# Patient Record
Sex: Male | Born: 1985 | Race: White | Hispanic: No | Marital: Married | Smoking: Never smoker
Health system: Southern US, Community
[De-identification: ages and names within clinical notes are randomized; demographics above are authoritative.]

---

## 2012-06-23 ENCOUNTER — Emergency Department: Payer: Self-pay | Admitting: Emergency Medicine

## 2014-04-28 IMAGING — CR RIGHT MIDDLE FINGER 2+V
1 series · 3 of 3 positions shown · non-contrast
Comparison: none

REASON FOR EXAM: laceration
COMMENTS:   May transport without cardiac monitor

[Series 1: pa · 0.17mm/px · 3 of 3 slices shown]
[im 1/3]
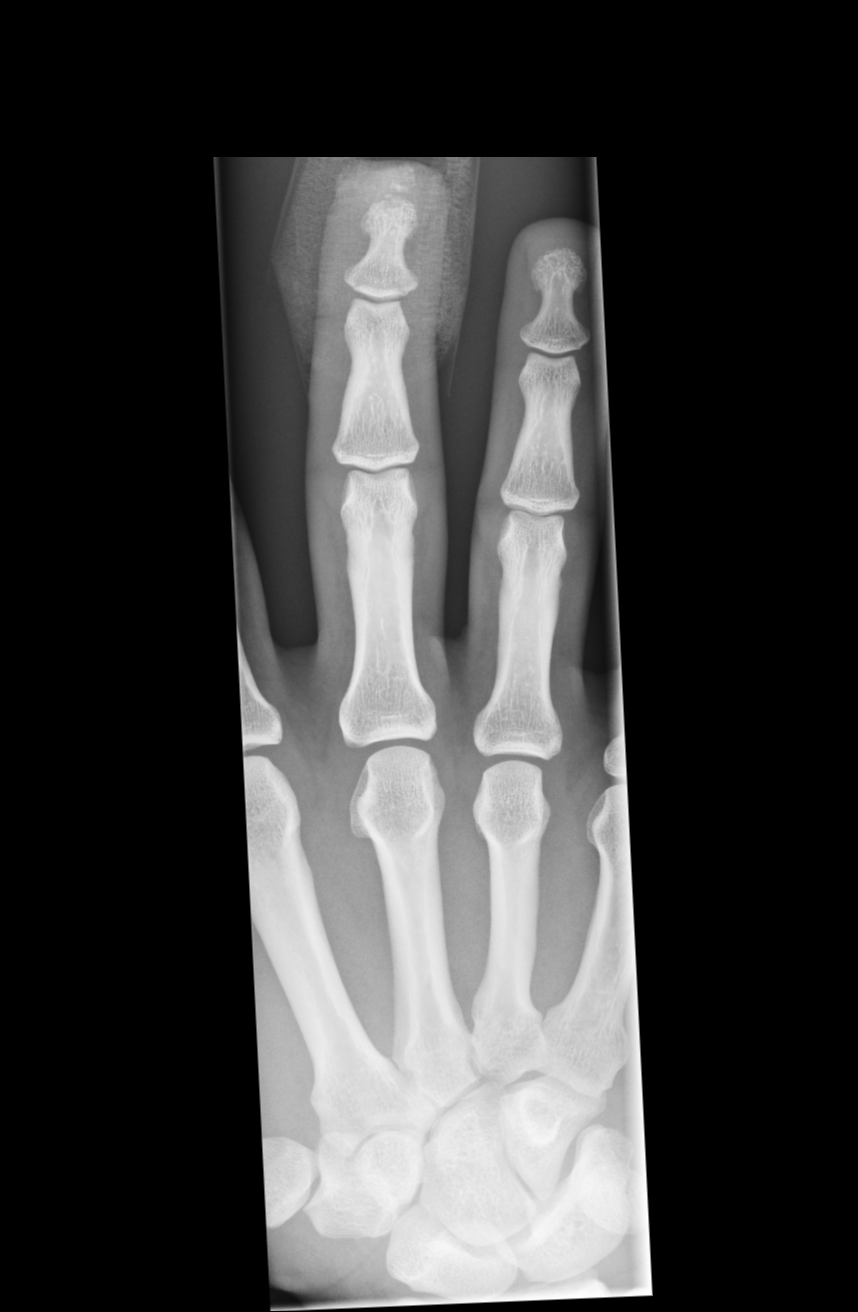
[im 2/3]
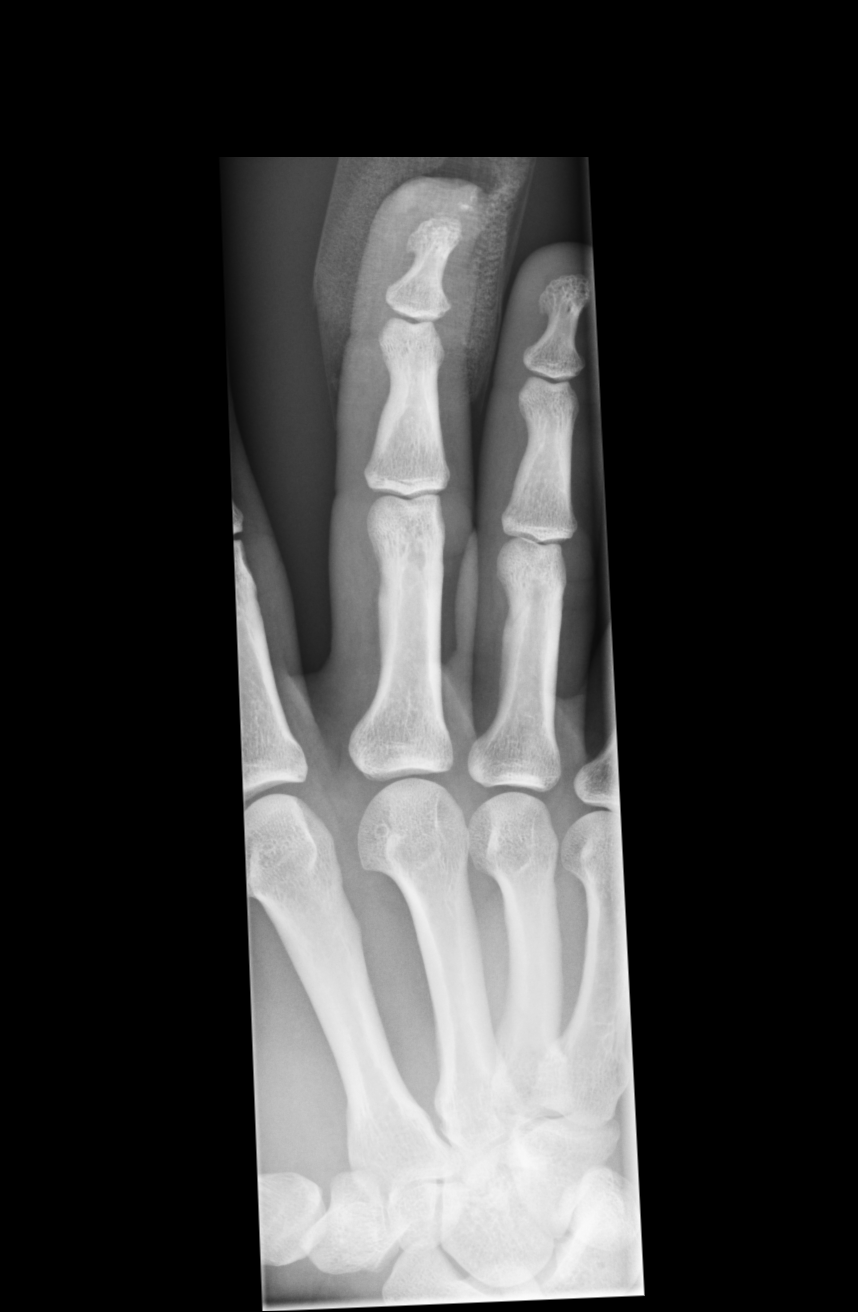
[im 3/3]
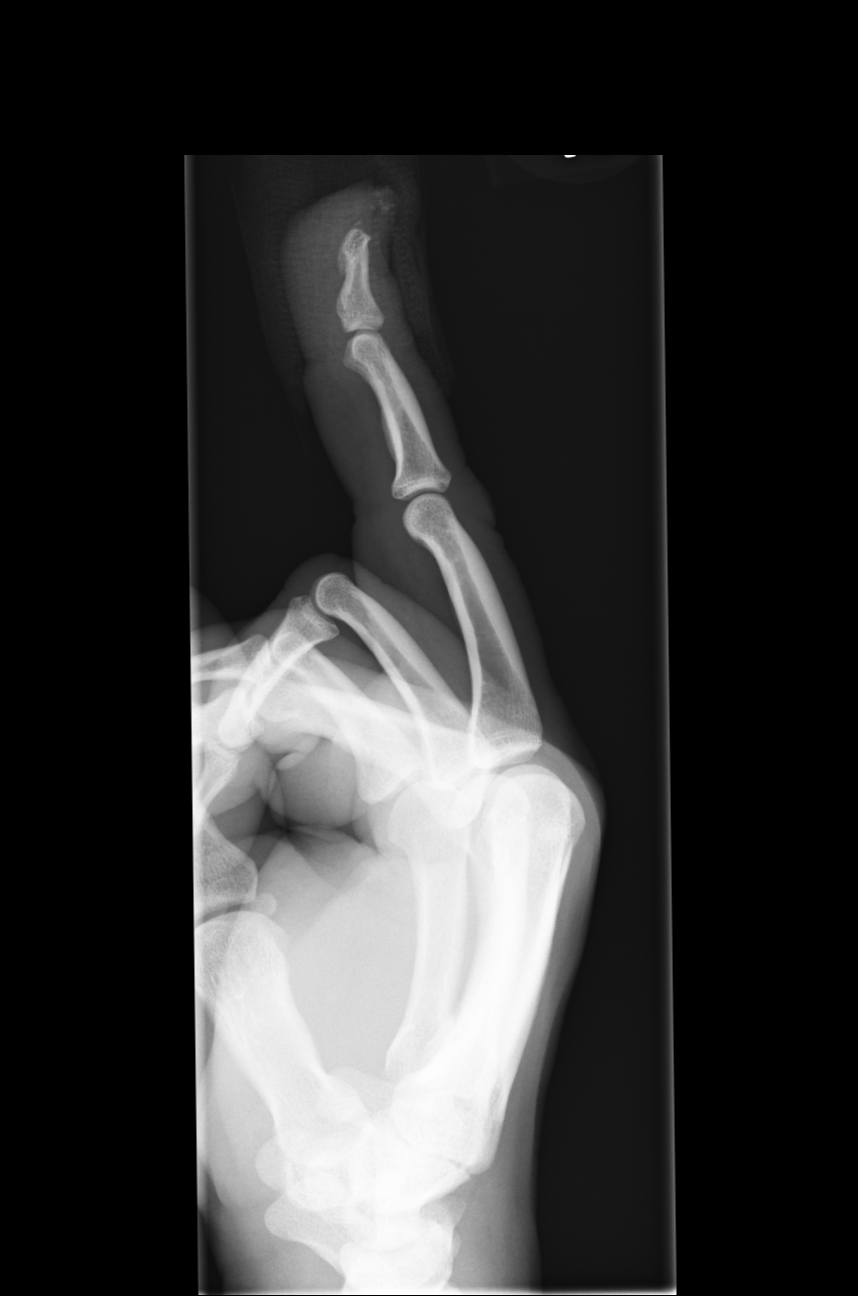

[3 of 3 positions shown; findings below may reference images not displayed]

PROCEDURE:     DXR - DXR FINGER MID 3RD DIGIT RT HAND  - June 23, 2012  [DATE]

RESULT:     There is a bandage present. There is traumatic amputation of the
soft tissue at the distal right third finger with avulsion of the tuft.
There may be additional nondisplaced fracture in the distal tuft of the
distal phalanx.
IMPRESSION: Soft tissue bony injury as described.

[REDACTED]

## 2014-11-09 ENCOUNTER — Ambulatory Visit (INDEPENDENT_AMBULATORY_CARE_PROVIDER_SITE_OTHER): Payer: Self-pay | Admitting: Family Medicine

## 2014-11-09 VITALS — BP 118/86 | HR 69 | Temp 98.5°F | Resp 16 | Ht 74.0 in | Wt 231.0 lb

## 2014-11-09 DIAGNOSIS — Z024 Encounter for examination for driving license: Secondary | ICD-10-CM

## 2014-11-09 DIAGNOSIS — Z029 Encounter for administrative examinations, unspecified: Secondary | ICD-10-CM

## 2014-11-09 NOTE — Progress Notes (Signed)
Patient ID: Jacob Lyons, male    DOB: [birth year]  Age: 29 y.o. MRN: [id number]  Chief Complaint  Patient presents with  . 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 He has healthy.  Past medical history: Operations: Only had a cyst removed from his scalp Illnesses: None Medications: None Allergies: None   Current allergies, medications, problem list, past/family and social histories reviewed.  Objective:  BP 118/86 mmHg  Pulse 69  Temp(Src) 98.5 F (36.9 C)  Resp 16  Ht  (1.88 m)  Wt 231 lb (104.781 kg)  BMI 29.65 kg/m2  SpO2 95%  No acute distress. TMs are normal except for a little old scarring. His eyes PERRLA. Fundi benign. Throat clear. Neck supple without nodes or thyromegaly. No carotid bruits. Chest is clear to auscultation. Heart regular without murmurs. Abdomen is soft without organomegaly, masses, or tenderness. Normal male external genitalia, circumcised, testes descended with no abnormalities. No hernias. Extremities unremarkable. Skin unremarkable. Spine normal. Deep tender reflexes 1+, symmetrical.  Assessment & Plan:   Assessment: 1. Driver's permit PE (physical examination)       Plan: 2 year DOT card completed Patient Instructions  Return as needed  Advised to be cautious with his weight.     Return if symptoms worsen or fail to improve.   HOPPER,Stephenia Vogan, MD 11/09/2014

## 2014-11-09 NOTE — Patient Instructions (Signed)
Return as needed  Advised to be cautious with his weight.

## 2016-11-01 ENCOUNTER — Encounter: Payer: Self-pay | Admitting: Physician Assistant

## 2016-11-01 ENCOUNTER — Ambulatory Visit (INDEPENDENT_AMBULATORY_CARE_PROVIDER_SITE_OTHER): Payer: Self-pay | Admitting: Physician Assistant

## 2016-11-01 VITALS — BP 136/82 | HR 78 | Temp 98.1°F | Resp 16 | Ht 71.85 in | Wt 233.8 lb

## 2016-11-01 DIAGNOSIS — Z0289 Encounter for other administrative examinations: Secondary | ICD-10-CM

## 2016-11-01 NOTE — Progress Notes (Signed)
Commercial Driver Medical Examination   Jacob Lyons is a 31 y.o. male with a pertinent medical history of no previous pertinent medical problems who presents today for a commercial driver fitness determination physical exam. The patient reports no problems today. In the past the patient reports receiving 2 year certificates. He denies focal neurological deficits, vision and hearing changes. He denies the habitual use of benzodiazepines, opioids, amphetamines and denies illicit drug use.   Current medications, family history, allergies, social history reviewed by me and exist elsewhere in the encounter.   Review of Systems  Constitutional: Negative for chills, diaphoresis and fever.  Eyes: Negative.   Respiratory: Negative for cough, hemoptysis, sputum production, shortness of breath and wheezing.   Cardiovascular: Negative for chest pain, orthopnea and leg swelling.  Gastrointestinal: Negative for abdominal pain, blood in stool, constipation, diarrhea, heartburn, melena, nausea and vomiting.  Genitourinary: Negative for dysuria, flank pain, frequency, hematuria and urgency.  Skin: Negative for rash.  Neurological: Negative for dizziness, sensory change, speech change, focal weakness and headaches.    Objective:     Vision/hearing:  Visual Acuity Screening   Right eye Left eye Both eyes  Without correction:  With correction:     Comments: Field of vision: 85 bilateral Color Vision: Normal   Hearing Screening Comments: Whisper Test passed at 10 feet bilateral  Applicant can recognize and distinguish among traffic control signals and devices showing standard red, green, and amber colors.  Corrective lenses required: No  Monocular Vision?: No  Hearing aid requirement: No  Physical Exam  Constitutional: He is oriented to person, place, and time. He appears well-developed. He is active and cooperative.  Non-toxic appearance.  HENT:  Right Ear: Hearing,  tympanic membrane, external ear and ear canal normal.  Left Ear: Hearing, tympanic membrane, external ear and ear canal normal.  Nose: Nose normal. Right sinus exhibits no maxillary sinus tenderness and no frontal sinus tenderness. Left sinus exhibits no maxillary sinus tenderness and no frontal sinus tenderness.  Mouth/Throat: Uvula is midline, oropharynx is clear and moist and mucous membranes are normal. No oropharyngeal exudate, posterior oropharyngeal edema or tonsillar abscesses.  Eyes: Pupils are equal, round, and reactive to light. Conjunctivae and EOM are normal.  Cardiovascular: Normal rate, regular rhythm, S1 normal, S2 normal, normal heart sounds, intact distal pulses and normal pulses.  Exam reveals no gallop and no friction rub.   No murmur heard. Pulmonary/Chest: Effort normal. No stridor. No tachypnea. No respiratory distress. He has no wheezes. He has no rales.  Abdominal: Soft. Normal appearance and bowel sounds are normal. He exhibits no distension and no mass. There is no tenderness. There is no rigidity, no rebound, no guarding and no CVA tenderness. No hernia.  Musculoskeletal: He exhibits no edema.  Lymphadenopathy:       Head (right side): No submandibular and no tonsillar adenopathy present.       Head (left side): No submandibular and no tonsillar adenopathy present.    He has no cervical adenopathy.  Neurological: He is alert and oriented to person, place, and time. He has normal strength and normal reflexes. He is not disoriented. No cranial nerve deficit or sensory deficit. He exhibits normal muscle tone. Coordination and gait normal.  Skin: Skin is warm and dry. He is not diaphoretic. No pallor.  Psychiatric: His behavior is normal.  Vitals reviewed.   BP 136/82 (BP Location: Right Arm, Patient[place]azilandsition: Sitting, Cuff Size: Normal)   Pulse 78  Temp 98.1 F (36.7 C) (Oral)   Resp 16   Ht 5' 11.85" (1.825 m)   Wt 233 lb 12.8 oz (106.1 kg)   SpO2 100%   BMI  31.84 kg/m   Labs: Comments: SG: 1.010; Protein: Neg; Glucose: Neg; Blood: Neg (UA Results)  Assessment:    Healthy male exam.  Meets standards in 17 CFR 391.41;  qualifies for 2 year certificate.    Plan:    Medical examiners certificate completed and printed. Return as needed.    Deliah Boston, MS, PA-C 5:15 PM, 11/01/2016

## 2016-11-03 ENCOUNTER — Ambulatory Visit: Payer: Self-pay | Admitting: Family Medicine

## 2016-11-03 ENCOUNTER — Ambulatory Visit: Payer: Self-pay | Admitting: Physician Assistant

## 2022-09-17 ENCOUNTER — Encounter: Payer: Self-pay | Admitting: Urology

## 2022-09-17 ENCOUNTER — Ambulatory Visit (INDEPENDENT_AMBULATORY_CARE_PROVIDER_SITE_OTHER): Payer: Commercial Managed Care - HMO | Admitting: Urology

## 2022-09-17 VITALS — BP 157/88 | HR 99 | Ht 72.0 in | Wt 230.0 lb

## 2022-09-17 DIAGNOSIS — Z3009 Encounter for other general counseling and advice on contraception: Secondary | ICD-10-CM | POA: Diagnosis not present

## 2022-09-17 NOTE — Patient Instructions (Signed)

## 2022-09-17 NOTE — Progress Notes (Signed)
09/17/2022 3:54 PM   Jacob Lyons May 29, 1985 [id number]  Referring provider: Wilford Corner, PA-C [address] [place],  [place] [address]  Chief Complaint  Patient presents with   sterilization   VAS Consult    HPI: Jacob Lyons is a 37 y.o. male who presents for vasectomy counseling.  Married with 2 children Denies prior history urologic problems including chronic scrotal pain, epididymitis or orchitis No previous history inguinal hernia or pelvic surgery No history of bleeding or clotting disorders   PMH: No past medical history on file.  Surgical History: No past surgical history on file.  Home Medications:  Allergies as of 09/17/2022   No Known Allergies      Medication List    as of September 17, 2022  3:54 PM   You have not been prescribed any medications.     Allergies: No Known Allergies  Family History: No family history on file.  Social History:  reports that he has never smoked. He has never used smokeless tobacco. He reports that he does not drink alcohol and does not use drugs.   Physical Exam: BP (!) 157/88   Pulse 99   Ht 6' (1.829 m)   Wt 230 lb (104.3 kg)   BMI 31.19 kg/m   Constitutional:  Alert and oriented, No acute distress. HEENT: [place] AT Respiratory: Normal respiratory effort, no increased work of breathing. GU: Phallus without lesions, testes descended bilaterally without masses or tenderness, spermatic cord/epididymis palpably normal bilaterally.  Vasa palpable bilaterally Psychiatric: Normal mood and affect.   Assessment & Plan:    1.  Undesired fertility Desires to schedule vasectomy We had a long discussion about vasectomy. We specifically discussed the procedure, recovery and the risks, benefits and alternatives of vasectomy. I explained that the procedure entails removal of a segment of each vas deferens, each of which conducts sperm, and that the purpose of this procedure is to cause sterility (inability  to produce children or cause pregnancy). Vasectomy is intended to be permanent and irreversible form of contraception. Options for fertility after vasectomy include vasectomy reversal, or sperm retrieval with in vitro fertilization. These options are not always successful, and they may be expensive. We discussed reversible forms of birth control such as condoms, IUD or diaphragms, as well as the option of freezing sperm in a sperm bank prior to the vasectomy procedure. We discussed the importance of avoiding strenuous exercise for four days after vasectomy, and the importance of refraining from any form of ejaculation for seven days after vasectomy. I explained that vasectomy does not produce immediate sterility so another form of contraceptive must be used until sterility is assured by having semen checked for sperm. Thus, a post vasectomy semen analysis is necessary to confirm sterility. Rarely, vasectomy must be repeated. We discussed the approximately 1 in 2,000 risk of pregnancy after vasectomy for men who have post-vasectomy semen analysis showing absent sperm or rare non-motile sperm. Typical side effects include a small amount of oozing blood, some discomfort and mild swelling in the area of incision.  Vasectomy does not affect sexual performance, function, please, sensation, interest, desire, satisfaction, penile erection, volume of semen or ejaculation. Other rare risks include allergy or adverse reaction to an anesthetic, testicular atrophy, hematoma, infection/abscess, prolonged tenderness of the vas deferens, pain, swelling, painful nodule or scar (called sperm granuloma) or epididymtis. We discussed chronic testicular pain syndrome. This has been reported to occur in as many as 1-2% of men and may  be permanent. This can be treated with medication, small procedures or (rarely) surgery. He indicated he would call back if he desires a preprocedure anxiolytic and would need a driver if  utilizing   Riki Altes, MD  [facility] [address] [place], [place] [address] [phone]

## 2022-09-19 ENCOUNTER — Encounter: Payer: Self-pay | Admitting: Urology

## 2022-10-19 ENCOUNTER — Ambulatory Visit (INDEPENDENT_AMBULATORY_CARE_PROVIDER_SITE_OTHER): Payer: Commercial Managed Care - HMO | Admitting: Urology

## 2022-10-19 VITALS — BP 148/92 | HR 94 | Ht 72.0 in | Wt 230.0 lb

## 2022-10-19 DIAGNOSIS — Z302 Encounter for sterilization: Secondary | ICD-10-CM | POA: Diagnosis not present

## 2022-10-19 MED ORDER — HYDROCODONE-ACETAMINOPHEN 5-325 MG PO TABS: 1.0000 | ORAL_TABLET | Freq: Four times a day (QID) | ORAL | 0 refills | Status: AC | PRN

## 2022-10-19 NOTE — Patient Instructions (Signed)

## 2022-10-19 NOTE — Progress Notes (Signed)
   Vasectomy Procedure Note  Indications: Jacob Lyons is a 37 y.o. male who presents today for elective sterilization.  He has been consented for the procedure.  He is aware of the risks and benefits.  He had no additional questions.  He agrees to proceed.  He denies any other significant change since his last visit.  Pre-operative Diagnosis: Elective sterilization  Post-operative Diagnosis: Elective sterilization  Premedication: N/A  Surgeon: Lorin Picket C. Alfhild Partch, M.D  Description: The patient was prepped and draped in the standard fashion.  The right vas deferens was identified and brought superiorly to the anterior scrotal skin.  The skin and vas were then anesthetized utilizing 8 ml 1% lidocaine.  A small stab incision was made and spread with the vas dissector.  The vas was grasped utilizing the vas clamp and elevated out of the incision.  The vas was dissected free from surrounding tissue and vessels and an ~1 cm segment was excised.  The vas lumens were cauterized utilizing electrocautery.  The distal segment was buried in the surrounding sheath with a 3-0 chromic suture.  No significant bleeding was observed.  The vas ends were then dropped back into the hemiscrotum.  The skin was closed with hemostatic pressure.  An identical procedure was performed on the contralateral side.  Clean dry gauze was applied to the incision sites.  The patient tolerated the procedure well.  Complications:None  Recommendations: 1.  No lifting greater than 10 pounds or strenuous activity for 1 week. 2.  Scrotal support for 1-2 weeks. 3.  May shower in 24 hours; no bath, hot tub for 1 week 4.  No intercourse for at least 7 days and resume based on level of discomfort  5.  Continue alternate contraception for 12 weeks.  6.  Call for significant pain, swelling, redness, drainage or fever greater than 100.5. 7.  Rx hydrocodone/APAP 5/325 1-2 every 6 hours prn pain. 8.  Follow-up semen analysis in 12  weeks.   Irineo Axon, MD

## 2023-01-24 ENCOUNTER — Other Ambulatory Visit: Payer: Self-pay | Admitting: *Deleted

## 2023-01-24 DIAGNOSIS — Z9852 Vasectomy status: Secondary | ICD-10-CM

## 2023-01-25 ENCOUNTER — Other Ambulatory Visit: Payer: Commercial Managed Care - HMO

## 2023-01-25 DIAGNOSIS — Z9852 Vasectomy status: Secondary | ICD-10-CM

## 2023-01-26 LAB — POST-VAS SPERM EVALUATION,QUAL: Volume: 5.2 mL

## 2023-06-05 ENCOUNTER — Encounter: Payer: Self-pay | Admitting: *Deleted

## 2023-06-05 ENCOUNTER — Other Ambulatory Visit: Payer: Self-pay

## 2023-06-05 ENCOUNTER — Emergency Department
Admission: EM | Admit: 2023-06-05 | Discharge: 2023-06-06 | Disposition: A | Discharge status: Home / Self Care | Attending: Emergency Medicine | Admitting: Emergency Medicine

## 2023-06-05 ENCOUNTER — Emergency Department

## 2023-06-05 DIAGNOSIS — T18128A Food in esophagus causing other injury, initial encounter: Secondary | ICD-10-CM | POA: Insufficient documentation

## 2023-06-05 DIAGNOSIS — W44F3XA Food entering into or through a natural orifice, initial encounter: Secondary | ICD-10-CM | POA: Insufficient documentation

## 2023-06-05 MED ORDER — ONDANSETRON HCL 4 MG/2ML IJ SOLN
4.0000 mg | Freq: Once | INTRAMUSCULAR | Status: AC
  Administered 2023-06-05: 4 mg via INTRAVENOUS
  Filled 2023-06-05: qty 2

## 2023-06-05 MED ORDER — LORAZEPAM 2 MG/ML IJ SOLN
1.0000 mg | Freq: Once | INTRAMUSCULAR | Status: DC
  Filled 2023-06-05: qty 1

## 2023-06-05 MED ORDER — NITROGLYCERIN 0.4 MG SL SUBL
0.4000 mg | SUBLINGUAL_TABLET | Freq: Once | SUBLINGUAL | Status: AC
  Administered 2023-06-05: 0.4 mg via SUBLINGUAL
  Filled 2023-06-05: qty 1

## 2023-06-05 MED ORDER — GLUCAGON HCL RDNA (DIAGNOSTIC) 1 MG IJ SOLR
1.0000 mg | Freq: Once | INTRAMUSCULAR | Status: AC
  Administered 2023-06-06: 1 mg via INTRAVENOUS
  Filled 2023-06-05: qty 1

## 2023-06-05 MED ORDER — ACETAMINOPHEN 500 MG PO TABS
1000.0000 mg | ORAL_TABLET | Freq: Once | ORAL | Status: DC
Start: 2023-06-05 — End: 2023-06-05
  Filled 2023-06-05: qty 2

## 2023-06-05 MED ORDER — GLUCAGON HCL RDNA (DIAGNOSTIC) 1 MG IJ SOLR: 1.0000 mg | Freq: Once | INTRAMUSCULAR | Status: DC

## 2023-06-05 MED ORDER — LIDOCAINE VISCOUS HCL 2 % MT SOLN: 15.0000 mL | Freq: Once | OROMUCOSAL | Status: DC

## 2023-06-05 MED ORDER — SODIUM CHLORIDE 0.9 % IV BOLUS
500.0000 mL | Freq: Once | INTRAVENOUS | Status: AC
  Administered 2023-06-05: 500 mL via INTRAVENOUS

## 2023-06-05 MED ORDER — ALUM & MAG HYDROXIDE-SIMETH 200-200-20 MG/5ML PO SUSP: 30.0000 mL | Freq: Once | ORAL | Status: DC

## 2023-06-05 NOTE — ED Notes (Signed)
 Patient given a cup of water to drink.  Patient tolerated PO challenge well, and stated that he feels much better now.  He believes that he vomited up the food bolus shortly after arriving to the ED.  Dr. Peggi Bowels has been notified.

## 2023-06-05 NOTE — ED Notes (Signed)
 PT wanting to hold off on Tylenol  due to N/V.

## 2023-06-05 NOTE — ED Notes (Signed)
 Pt to radiology for Xray at this time.

## 2023-06-05 NOTE — Discharge Instructions (Signed)
 You may use Lidocaine  as needed for throat discomfort.  Please use a soft diet and to call your GI doctor to make a follow-up appointment and return to the ER if you develop worsening symptoms or any other concerns

## 2023-06-05 NOTE — ED Provider Notes (Addendum)
 [facility] Provider Note    Event Date/Time   First MD Initiated Contact with Patient 06/05/23 2219     (approximate)   History   Foreign Body   HPI  Jacob Lyons is a 38 y.o. male who is otherwise healthy who reports eating chicken at 730 and feeling to get stuck in his throat.  Patient was spitting up and states that he could not get it up.  He reports that this has happened 1-2 times a year has never had an endoscopy before never had to have to come into the emergency room.  When patient was coming back to the room he reports vomiting which dislodged the piece of chicken and he reports now feeling at his baseline self.  He denies any residual symptoms.  No chest pain.  Physical Exam   Triage Vital Signs: ED Triage Vitals [06/05/23 2210]  Encounter Vitals Group     BP (!) 149/101     Systolic BP Percentile      Diastolic BP Percentile      Pulse Rate (!) 107     Resp (!) 26     Temp 97.7 F (36.5 C)     Temp Source Oral     SpO2 100 %     Weight 235 lb (106.6 kg)     Height 6' (1.829 m)     Head Circumference      Peak Flow      Pain Score 10     Pain Loc      Pain Education      Exclude from Growth Chart     Most recent vital signs: Vitals:   06/05/23 2210  BP: (!) 149/101  Pulse: (!) 107  Resp: (!) 26  Temp: 97.7 F (36.5 C)  SpO2: 100%     General: Awake, no distress.  CV:  Good peripheral perfusion.  Resp:  Normal effort.  Abd:  No distention.  Other:  No chest wall tenderness no abdominal tenderness   ED Results / Procedures / Treatments   Labs (all labs ordered are listed, but only abnormal results are displayed) Labs Reviewed - No data to display    PROCEDURES:  Critical Care performed: No  Procedures   MEDICATIONS ORDERED IN ED: Medications - No data to display   IMPRESSION / MDM / ASSESSMENT AND PLAN / ED COURSE  I reviewed the triage vital signs and the nursing notes.   Patient's presentation  is most consistent with acute, uncomplicated illness.   Patient comes in with concerns for food impaction.  However upon coming to the room patient felt like it had passed.  Patient is tolerating p.o.  He has resolution of symptoms.  No chest pain to suggest esophageal perforation.  We discussed soft diet and i given that he has had a history have this happening multiple times he needs to follow-up with GI for endoscopy to rule out esophageal mass, stricture.  Patient expressed understanding and will be discharged home  Pt did report a little discomfort in esophagus area after this episode. Will get xray to rule out any perforation but seems less likely- no crepitus on exam. Will get EKG but does not sound cardiac in nature. Pt handed off pending EKG/chest xray and suspect dc home.   11:18 PM pt states now he feels like it is back. And he is spitting again.  A little atypical for the pain to go away and then get stuck  again so will try some glucagon  and diet coke to see if sensation passes and if not will call gi.   Pt handed off to dr sung pending medications.    FINAL CLINICAL IMPRESSION(S) / ED DIAGNOSES   Final diagnoses:  Esophageal obstruction due to food impaction     Rx / DC Orders   ED Discharge Orders     None        Note:  This document was prepared using Dragon voice recognition software and may include unintentional dictation errors.   Lubertha Rush, MD 06/05/23 2238    Lubertha Rush, MD 06/05/23 1308    Lubertha Rush, MD 06/05/23 [phone]

## 2023-06-05 NOTE — ED Triage Notes (Signed)
 Pt ambulatory to triage.  Pt reports he ate chicken at 1930 and now it is lodged in his chest.  Pt anxious.  Pt alert.  Pt spitting up in triage.

## 2023-06-06 LAB — CBC WITH DIFFERENTIAL/PLATELET
Abs Immature Granulocytes: 0.05 10*3/uL (ref 0.00–0.07)
Basophils Absolute: 0.1 10*3/uL (ref 0.0–0.1)
Basophils Relative: 0 %
Eosinophils Absolute: 0 10*3/uL (ref 0.0–0.5)
Eosinophils Relative: 0 %
HCT: 45.4 % (ref 39.0–52.0)
Hemoglobin: 16 g/dL (ref 13.0–17.0)
Immature Granulocytes: 0 %
Lymphocytes Relative: 7 %
Lymphs Abs: 1 10*3/uL (ref 0.7–4.0)
MCH: 29.3 pg (ref 26.0–34.0)
MCHC: 35.2 g/dL (ref 30.0–36.0)
MCV: 83 fL (ref 80.0–100.0)
Monocytes Absolute: 0.4 10*3/uL (ref 0.1–1.0)
Monocytes Relative: 3 %
Neutro Abs: 12.8 10*3/uL — ABNORMAL HIGH (ref 1.7–7.7)
Neutrophils Relative %: 90 %
Platelets: 241 10*3/uL (ref 150–400)
RBC: 5.47 MIL/uL (ref 4.22–5.81)
RDW: 11.9 % (ref 11.5–15.5)
WBC: 14.3 10*3/uL — ABNORMAL HIGH (ref 4.0–10.5)
nRBC: 0 % (ref 0.0–0.2)

## 2023-06-06 LAB — COMPREHENSIVE METABOLIC PANEL WITH GFR
ALT: 33 U/L (ref 0–44)
AST: 27 U/L (ref 15–41)
Albumin: 4.8 g/dL (ref 3.5–5.0)
Alkaline Phosphatase: 47 U/L (ref 38–126)
Anion gap: 12 (ref 5–15)
BUN: 25 mg/dL — ABNORMAL HIGH (ref 6–20)
CO2: 21 mmol/L — ABNORMAL LOW (ref 22–32)
Calcium: 8.9 mg/dL (ref 8.9–10.3)
Chloride: 106 mmol/L (ref 98–111)
Creatinine, Ser: 0.83 mg/dL (ref 0.61–1.24)
GFR, Estimated: >60 mL/min (ref >60)
Glucose, Bld: 134 mg/dL — ABNORMAL HIGH (ref 70–99)
Potassium: 3.3 mmol/L — ABNORMAL LOW (ref 3.5–5.1)
Sodium: 139 mmol/L (ref 135–145)
Total Bilirubin: 1.3 mg/dL — ABNORMAL HIGH (ref 0.0–1.2)
Total Protein: 7.8 g/dL (ref 6.5–8.1)

## 2023-06-06 LAB — TROPONIN I (HIGH SENSITIVITY)
Troponin I (High Sensitivity): <2 ng/L (ref <18)
Troponin I (High Sensitivity): <2 ng/L (ref <18)

## 2023-06-06 MED ORDER — LIDOCAINE VISCOUS HCL 2 % MT SOLN: 15.0000 mL | Freq: Four times a day (QID) | OROMUCOSAL | 0 refills | Status: AC | PRN

## 2023-06-06 NOTE — ED Notes (Addendum)
 Pt reporting to ED d/t foreign body stuck in throat. Pt states that he was eating chicken tonight when he suddenly felt like a piece got stuck. Pt nauseous and actively vomiting into bucket. Pt endorsing sore throat, mild chest discomfort. Pt aox4. Pt ABCs intact with no signs of airway compromise. RR even and unlabored. Bed in lowest locked position. Call bell in reach. Denies needs at this time. Pt connected to monitor.   History reviewed. No pertinent past medical history.

## 2023-06-06 NOTE — ED Notes (Signed)
 Pt discharged to ED circle at this time and left with all belongings. Pt ABCs intact. RR even and unlabored. Pt in NAD. Pt denies further needs from this RN.

## 2023-06-06 NOTE — ED Provider Notes (Signed)
-----------------------------------------   12:58 AM on 06/06/2023 -----------------------------------------   Patient feeling significantly better.  Laboratory results remarkable for moderate leukocytosis which is likely secondary to stress reaction and slight hypokalemia.  Patient tolerating secretions without difficulty.  No airway distress.  Will discharge home with as needed viscous lidocaine  and patient will follow-up with GI.  Strict return precautions given.  Patient and spouse verbalized understanding and agree with plan of care.   Jacob Lyons J, MD 06/06/23 [phone]
